# Patient Record
Sex: Male | Born: 1960 | Race: Black or African American | Hispanic: No | Marital: Married | Smoking: Never smoker
Health system: Southern US, Community
[De-identification: ages and names within clinical notes are randomized; demographics above are authoritative.]

## PROBLEM LIST (undated history)

## (undated) DIAGNOSIS — G5602 Carpal tunnel syndrome, left upper limb: Secondary | ICD-10-CM

## (undated) DIAGNOSIS — I10 Essential (primary) hypertension: Secondary | ICD-10-CM

## (undated) DIAGNOSIS — G992 Myelopathy in diseases classified elsewhere: Secondary | ICD-10-CM

## (undated) DIAGNOSIS — K219 Gastro-esophageal reflux disease without esophagitis: Secondary | ICD-10-CM

## (undated) DIAGNOSIS — E669 Obesity, unspecified: Secondary | ICD-10-CM

## (undated) DIAGNOSIS — M1990 Unspecified osteoarthritis, unspecified site: Secondary | ICD-10-CM

## (undated) DIAGNOSIS — M4802 Spinal stenosis, cervical region: Secondary | ICD-10-CM

## (undated) DIAGNOSIS — E7800 Pure hypercholesterolemia, unspecified: Secondary | ICD-10-CM

## (undated) DIAGNOSIS — R609 Edema, unspecified: Secondary | ICD-10-CM

## (undated) DIAGNOSIS — Z973 Presence of spectacles and contact lenses: Secondary | ICD-10-CM

## (undated) DIAGNOSIS — Z8739 Personal history of other diseases of the musculoskeletal system and connective tissue: Secondary | ICD-10-CM

## (undated) HISTORY — PX: MULTIPLE TOOTH EXTRACTIONS: SHX2053

## (undated) HISTORY — PX: ESOPHAGOGASTRODUODENOSCOPY: SHX1529

---

## 2017-01-05 ENCOUNTER — Other Ambulatory Visit: Payer: Self-pay | Admitting: Neurosurgery

## 2017-01-09 ENCOUNTER — Encounter (HOSPITAL_COMMUNITY)
Admission: RE | Admit: 2017-01-09 | Discharge: 2017-01-09 | Disposition: A | Discharge status: Home / Self Care | Payer: BLUE CROSS/BLUE SHIELD | Source: Ambulatory Visit | Attending: Neurosurgery | Admitting: Neurosurgery

## 2017-01-09 ENCOUNTER — Encounter (HOSPITAL_COMMUNITY): Payer: Self-pay

## 2017-01-09 DIAGNOSIS — I10 Essential (primary) hypertension: Secondary | ICD-10-CM | POA: Diagnosis not present

## 2017-01-09 DIAGNOSIS — M4712 Other spondylosis with myelopathy, cervical region: Secondary | ICD-10-CM | POA: Insufficient documentation

## 2017-01-09 DIAGNOSIS — Z01810 Encounter for preprocedural cardiovascular examination: Secondary | ICD-10-CM | POA: Diagnosis not present

## 2017-01-09 DIAGNOSIS — Z01812 Encounter for preprocedural laboratory examination: Secondary | ICD-10-CM | POA: Insufficient documentation

## 2017-01-09 DIAGNOSIS — Z01818 Encounter for other preprocedural examination: Secondary | ICD-10-CM | POA: Diagnosis present

## 2017-01-09 HISTORY — DX: Personal history of other diseases of the musculoskeletal system and connective tissue: Z87.39

## 2017-01-09 HISTORY — DX: Spinal stenosis, cervical region: M48.02

## 2017-01-09 HISTORY — DX: Edema, unspecified: R60.9

## 2017-01-09 HISTORY — DX: Carpal tunnel syndrome, left upper limb: G56.02

## 2017-01-09 HISTORY — DX: Presence of spectacles and contact lenses: Z97.3

## 2017-01-09 HISTORY — DX: Pure hypercholesterolemia, unspecified: E78.00

## 2017-01-09 HISTORY — DX: Obesity, unspecified: E66.9

## 2017-01-09 HISTORY — DX: Gastro-esophageal reflux disease without esophagitis: K21.9

## 2017-01-09 HISTORY — DX: Unspecified osteoarthritis, unspecified site: M19.90

## 2017-01-09 HISTORY — DX: Essential (primary) hypertension: I10

## 2017-01-09 HISTORY — DX: Myelopathy in diseases classified elsewhere: G99.2

## 2017-01-09 LAB — SURGICAL PCR SCREEN
MRSA, PCR: NEGATIVE
STAPHYLOCOCCUS AUREUS: NEGATIVE

## 2017-01-09 LAB — BASIC METABOLIC PANEL
Anion gap: 8 (ref 5–15)
BUN: 13 mg/dL (ref 6–20)
CALCIUM: 9.8 mg/dL (ref 8.9–10.3)
CO2: 25 mmol/L (ref 22–32)
Chloride: 104 mmol/L (ref 101–111)
Creatinine, Ser: 1.08 mg/dL (ref 0.61–1.24)
GFR calc Af Amer: >60 mL/min (ref >60)
GLUCOSE: 134 mg/dL — AB (ref 65–99)
Potassium: 4.1 mmol/L (ref 3.5–5.1)
Sodium: 137 mmol/L (ref 135–145)

## 2017-01-09 LAB — CBC
HEMATOCRIT: 39.3 % (ref 39.0–52.0)
Hemoglobin: 12.7 g/dL — ABNORMAL LOW (ref 13.0–17.0)
MCH: 26.6 pg (ref 26.0–34.0)
MCHC: 32.3 g/dL (ref 30.0–36.0)
MCV: 82.4 fL (ref 78.0–100.0)
Platelets: 205 10*3/uL (ref 150–400)
RBC: 4.77 MIL/uL (ref 4.22–5.81)
RDW: 14.5 % (ref 11.5–15.5)
WBC: 6.3 10*3/uL (ref 4.0–10.5)

## 2017-01-09 LAB — TYPE AND SCREEN
ABO/RH(D): O POS
Antibody Screen: NEGATIVE

## 2017-01-09 LAB — ABO/RH: ABO/RH(D): O POS

## 2017-01-09 NOTE — Pre-Procedure Instructions (Signed)
Jimmy Boyer  01/09/2017     No Pharmacies Listed   Your procedure is scheduled on Tues. June 12  [facility] Admitting at 5:30 A.M.  Call this number if you have problems the morning of surgery:  [phone]   Remember:  Do not eat food or drink liquids after midnight.  Take these medicines the morning of surgery with A SIP OF WATER :             1 week prior to surgery stop: advil, motrin, aleve, ibuprofen, BC Powders, Goody's, vitamins/herbal medicines.   Do not wear jewelry, make-up or nail polish.  Do not wear lotions, powders, or perfumes, or deoderant.  Do not shave 48 hours prior to surgery.  Men may shave face and neck.  Do not bring valuables to the hospital.  [facility] is not responsible for any belongings or valuables.  Contacts, dentures or bridgework may not be worn into surgery.  Leave your suitcase in the car.  After surgery it may be brought to your room.  For patients admitted to the hospital, discharge time will be determined by your treatment team.  Patients discharged the day of surgery will not be allowed to drive home.    Special instructions:   [facility]- Preparing For Surgery  Before surgery, you can play an important role. Because skin is not sterile, your skin needs to be as free of germs as possible. You can reduce the number of germs on your skin by washing with CHG (chlorahexidine gluconate) Soap before surgery.  CHG is an antiseptic cleaner which kills germs and bonds with the skin to continue killing germs even after washing.  Please do not use if you have an allergy to CHG or antibacterial soaps. If your skin becomes reddened/irritated stop using the CHG.  Do not shave (including legs and underarms) for at least 48 hours prior to first CHG shower. It is OK to shave your face.  Please follow these instructions carefully.   1. Shower the NIGHT BEFORE SURGERY and the MORNING OF SURGERY with CHG.   2. If you  chose to wash your hair, wash your hair first as usual with your normal shampoo.  3. After you shampoo, rinse your hair and body thoroughly to remove the shampoo.  4. Use CHG as you would any other liquid soap. You can apply CHG directly to the skin and wash gently with a scrungie or a clean washcloth.   5. Apply the CHG Soap to your body ONLY FROM THE NECK DOWN.  Do not use on open wounds or open sores. Avoid contact with your eyes, ears, mouth and genitals (private parts). Wash genitals (private parts) with your normal soap.  6. Wash thoroughly, paying special attention to the area where your surgery will be performed.  7. Thoroughly rinse your body with warm water from the neck down.  8. DO NOT shower/wash with your normal soap after using and rinsing off the CHG Soap.  9. Pat yourself dry with a CLEAN TOWEL.   10. Wear CLEAN PAJAMAS   11. Place CLEAN SHEETS on your bed the night of your first shower and DO NOT SLEEP WITH PETS.    Day of Surgery: Do not apply any deodorants/lotions. Please wear clean clothes to the hospital/surgery center.      Please read over the following fact sheets that you were given. Coughing and Deep Breathing, MRSA Information and Surgical Site Infection Prevention

## 2017-01-09 NOTE — Pre-Procedure Instructions (Signed)
    Jimmy Boyer  01/09/2017     No Pharmacies Listed   Your procedure is scheduled on Tuesday, January 17, 2017  [facility] at 5:30 A.M.  Call this number if you have problems the morning of surgery:  [phone]   Remember:  Do not eat food or drink liquids after midnight Monday, January 16, 2017  Take these medicines the morning of surgery with A SIP OF WATER : gabapentin (NEURONTIN)  Stop taking Aspirin,vitamins, fish oil, and herbal medications. Do not take any NSAIDs ie: Ibuprofen, Advil, Naproxen, BC and Goody Powderor any medication containing Aspirin; stop Tuesday, January 10, 2017  Do not wear jewelry, make-up or nail polish.  Do not wear lotions, powders, or perfumes, or deoderant.  Do not shave 48 hours prior to surgery.  Men may shave face and neck.  Do not bring valuables to the hospital.  [facility] is not responsible for any belongings or valuables. Contacts, dentures or bridgework may not be worn into surgery.  Leave your suitcase in the car.  After surgery it may be brought to your room. For patients admitted to the hospital, discharge time will be determined by your treatment team. Special instructions: Shower the night before surgery and the morning of surgery with CHG. Please read over the following fact sheets that you were given. Pain Booklet, Coughing and Deep Breathing, Blood Transfusion Information, MRSA Information and Surgical Site Infection Prevention

## 2017-01-09 NOTE — Progress Notes (Signed)
Pt denies SOB, chest pain, and being under the care of a cardiologist. Pt denies having a stress test, echo and cardiac cath. Pt denies having an EKG within the last year but stated that a [facility]; records requested. Pt chart forwarded to anesthesia to review EKG.

## 2017-01-16 MED ORDER — DEXTROSE 5 % IV SOLN
3.0000 g | INTRAVENOUS | Status: AC
  Administered 2017-01-17: 3 g via INTRAVENOUS
  Filled 2017-01-16: qty 3000

## 2017-01-16 NOTE — Anesthesia Preprocedure Evaluation (Addendum)
Anesthesia Evaluation  Patient identified by MRN, date of birth, ID band Patient awake    Reviewed: Allergy & Precautions, H&P , NPO status , Patient's Chart, lab work & pertinent test results  Airway Mallampati: II  TM Distance: >3 FB Neck ROM: Full    Dental no notable dental hx. (+) Teeth Intact, Dental Advisory Given   Pulmonary neg pulmonary ROS,    Pulmonary exam normal breath sounds clear to auscultation       Cardiovascular Exercise Tolerance: Good hypertension, Pt. on medications  Rhythm:Regular Rate:Normal     Neuro/Psych negative neurological ROS  negative psych ROS   GI/Hepatic Neg liver ROS, GERD  Controlled,  Endo/Other  negative endocrine ROS  Renal/GU negative Renal ROS  negative genitourinary   Musculoskeletal  (+) Arthritis , Osteoarthritis,    Abdominal   Peds  Hematology negative hematology ROS (+)   Anesthesia Other Findings   Reproductive/Obstetrics negative OB ROS                            Anesthesia Physical Anesthesia Plan  ASA: II  Anesthesia Plan: General   Post-op Pain Management:    Induction: Intravenous  PONV Risk Score and Plan: 3 and Ondansetron, Dexamethasone, Propofol and Midazolam  Airway Management Planned: Oral ETT  Additional Equipment:   Intra-op Plan:   Post-operative Plan: Extubation in OR  Informed Consent: I have reviewed the patients History and Physical, chart, labs and discussed the procedure including the risks, benefits and alternatives for the proposed anesthesia with the patient or authorized representative who has indicated his/her understanding and acceptance.   Dental advisory given  Plan Discussed with: CRNA  Anesthesia Plan Comments:        Anesthesia Quick Evaluation

## 2017-01-17 ENCOUNTER — Observation Stay (HOSPITAL_COMMUNITY)
Admission: AD | Admit: 2017-01-17 | Discharge: 2017-01-18 | DRG: 473 | Disposition: A | Discharge status: Home / Self Care | Payer: BLUE CROSS/BLUE SHIELD | Source: Ambulatory Visit | Attending: Neurosurgery | Admitting: Neurosurgery

## 2017-01-17 ENCOUNTER — Encounter (HOSPITAL_COMMUNITY): Payer: Self-pay | Admitting: *Deleted

## 2017-01-17 ENCOUNTER — Encounter (HOSPITAL_COMMUNITY)
Admission: AD | Disposition: A | Discharge status: Home / Self Care | Payer: Self-pay | Source: Ambulatory Visit | Attending: Neurosurgery

## 2017-01-17 ENCOUNTER — Ambulatory Visit (HOSPITAL_COMMUNITY): Payer: BLUE CROSS/BLUE SHIELD | Admitting: Emergency Medicine

## 2017-01-17 ENCOUNTER — Ambulatory Visit (HOSPITAL_COMMUNITY): Payer: BLUE CROSS/BLUE SHIELD

## 2017-01-17 DIAGNOSIS — M2578 Osteophyte, vertebrae: Secondary | ICD-10-CM | POA: Diagnosis not present

## 2017-01-17 DIAGNOSIS — G959 Disease of spinal cord, unspecified: Secondary | ICD-10-CM | POA: Diagnosis present

## 2017-01-17 DIAGNOSIS — M79603 Pain in arm, unspecified: Secondary | ICD-10-CM | POA: Diagnosis present

## 2017-01-17 DIAGNOSIS — Z419 Encounter for procedure for purposes other than remedying health state, unspecified: Secondary | ICD-10-CM

## 2017-01-17 DIAGNOSIS — K5900 Constipation, unspecified: Secondary | ICD-10-CM | POA: Diagnosis not present

## 2017-01-17 DIAGNOSIS — M50121 Cervical disc disorder at C4-C5 level with radiculopathy: Secondary | ICD-10-CM | POA: Insufficient documentation

## 2017-01-17 DIAGNOSIS — Z79899 Other long term (current) drug therapy: Secondary | ICD-10-CM | POA: Insufficient documentation

## 2017-01-17 DIAGNOSIS — M50021 Cervical disc disorder at C4-C5 level with myelopathy: Principal | ICD-10-CM | POA: Insufficient documentation

## 2017-01-17 DIAGNOSIS — I10 Essential (primary) hypertension: Secondary | ICD-10-CM | POA: Diagnosis not present

## 2017-01-17 DIAGNOSIS — M4802 Spinal stenosis, cervical region: Secondary | ICD-10-CM | POA: Insufficient documentation

## 2017-01-17 HISTORY — PX: ANTERIOR CERVICAL DECOMP/DISCECTOMY FUSION: SHX1161

## 2017-01-17 SURGERY — ANTERIOR CERVICAL DECOMPRESSION/DISCECTOMY FUSION 2 LEVELS
Anesthesia: General | Site: Spine Cervical

## 2017-01-17 MED ORDER — ONDANSETRON HCL 4 MG/2ML IJ SOLN
INTRAMUSCULAR | Status: DC | PRN
  Administered 2017-01-17: 4 mg via INTRAVENOUS

## 2017-01-17 MED ORDER — SUGAMMADEX SODIUM 500 MG/5ML IV SOLN
INTRAVENOUS | Status: AC
  Filled 2017-01-17: qty 5

## 2017-01-17 MED ORDER — GLYCOPYRROLATE 0.2 MG/ML IJ SOLN
INTRAMUSCULAR | Status: DC | PRN
  Administered 2017-01-17: 0.1 mg via INTRAVENOUS

## 2017-01-17 MED ORDER — PRAVASTATIN SODIUM 40 MG PO TABS: 40.0000 mg | ORAL_TABLET | Freq: Every day | ORAL | Status: DC

## 2017-01-17 MED ORDER — ACETAMINOPHEN 325 MG PO TABS: 650.0000 mg | ORAL_TABLET | ORAL | Status: DC | PRN

## 2017-01-17 MED ORDER — ACETAMINOPHEN 650 MG RE SUPP: 650.0000 mg | RECTAL | Status: DC | PRN

## 2017-01-17 MED ORDER — FENTANYL CITRATE (PF) 100 MCG/2ML IJ SOLN
INTRAMUSCULAR | Status: DC | PRN
  Administered 2017-01-17: 50 ug via INTRAVENOUS
  Administered 2017-01-17: 100 ug via INTRAVENOUS
  Administered 2017-01-17 (×2): 50 ug via INTRAVENOUS

## 2017-01-17 MED ORDER — LIDOCAINE HCL (CARDIAC) 20 MG/ML IV SOLN
INTRAVENOUS | Status: DC | PRN
  Administered 2017-01-17: 60 mg via INTRAVENOUS

## 2017-01-17 MED ORDER — LABETALOL HCL 5 MG/ML IV SOLN
INTRAVENOUS | Status: DC | PRN
  Administered 2017-01-17: 5 mg via INTRAVENOUS

## 2017-01-17 MED ORDER — HYDROMORPHONE HCL 1 MG/ML IJ SOLN
INTRAMUSCULAR | Status: AC
  Filled 2017-01-17: qty 1

## 2017-01-17 MED ORDER — PROPOFOL 10 MG/ML IV BOLUS
INTRAVENOUS | Status: DC | PRN
  Administered 2017-01-17: 140 mg via INTRAVENOUS
  Administered 2017-01-17: 30 mg via INTRAVENOUS

## 2017-01-17 MED ORDER — LIDOCAINE 2% (20 MG/ML) 5 ML SYRINGE
INTRAMUSCULAR | Status: AC
  Filled 2017-01-17: qty 5

## 2017-01-17 MED ORDER — SENNOSIDES-DOCUSATE SODIUM 8.6-50 MG PO TABS: 1.0000 | ORAL_TABLET | Freq: Every evening | ORAL | Status: DC | PRN

## 2017-01-17 MED ORDER — SODIUM CHLORIDE 0.9% FLUSH
3.0000 mL | Freq: Two times a day (BID) | INTRAVENOUS | Status: DC
  Administered 2017-01-17: 3 mL via INTRAVENOUS

## 2017-01-17 MED ORDER — PANTOPRAZOLE SODIUM 40 MG IV SOLR: 40.0000 mg | Freq: Every day | INTRAVENOUS | Status: DC

## 2017-01-17 MED ORDER — ALUM & MAG HYDROXIDE-SIMETH 200-200-20 MG/5ML PO SUSP: 30.0000 mL | Freq: Four times a day (QID) | ORAL | Status: DC | PRN

## 2017-01-17 MED ORDER — MIDAZOLAM HCL 2 MG/2ML IJ SOLN
INTRAMUSCULAR | Status: AC
  Filled 2017-01-17: qty 2

## 2017-01-17 MED ORDER — LIDOCAINE-EPINEPHRINE 1 %-1:100000 IJ SOLN
INTRAMUSCULAR | Status: AC
  Filled 2017-01-17: qty 1

## 2017-01-17 MED ORDER — SUGAMMADEX SODIUM 500 MG/5ML IV SOLN
INTRAVENOUS | Status: DC | PRN
  Administered 2017-01-17: 300 mg via INTRAVENOUS

## 2017-01-17 MED ORDER — THROMBIN 5000 UNITS EX SOLR
CUTANEOUS | Status: DC | PRN
  Administered 2017-01-17 (×2): 5000 [IU] via TOPICAL

## 2017-01-17 MED ORDER — THROMBIN 5000 UNITS EX SOLR
CUTANEOUS | Status: AC
  Filled 2017-01-17: qty 15000

## 2017-01-17 MED ORDER — MORPHINE SULFATE (PF) 4 MG/ML IV SOLN: 2.0000 mg | INTRAVENOUS | Status: DC | PRN

## 2017-01-17 MED ORDER — MIDAZOLAM HCL 5 MG/5ML IJ SOLN
INTRAMUSCULAR | Status: DC | PRN
  Administered 2017-01-17: 2 mg via INTRAVENOUS

## 2017-01-17 MED ORDER — DEXAMETHASONE SODIUM PHOSPHATE 10 MG/ML IJ SOLN
INTRAMUSCULAR | Status: AC
  Filled 2017-01-17: qty 1

## 2017-01-17 MED ORDER — THROMBIN 5000 UNITS EX SOLR
OROMUCOSAL | Status: DC | PRN
  Administered 2017-01-17: 5 mL via TOPICAL

## 2017-01-17 MED ORDER — MENTHOL 3 MG MT LOZG: 1.0000 | LOZENGE | OROMUCOSAL | Status: DC | PRN

## 2017-01-17 MED ORDER — ROCURONIUM BROMIDE 10 MG/ML (PF) SYRINGE
PREFILLED_SYRINGE | INTRAVENOUS | Status: AC
  Filled 2017-01-17: qty 5

## 2017-01-17 MED ORDER — LIDOCAINE-EPINEPHRINE 1 %-1:100000 IJ SOLN
INTRAMUSCULAR | Status: DC | PRN
  Administered 2017-01-17: 5 mL

## 2017-01-17 MED ORDER — LACTATED RINGERS IV SOLN
INTRAVENOUS | Status: DC | PRN
  Administered 2017-01-17 (×2): via INTRAVENOUS

## 2017-01-17 MED ORDER — TRIAMTERENE-HCTZ 37.5-25 MG PO CAPS
1.0000 | ORAL_CAPSULE | Freq: Every day | ORAL | Status: DC
  Administered 2017-01-17 – 2017-01-18 (×2): 1 via ORAL
  Filled 2017-01-17 (×2): qty 1

## 2017-01-17 MED ORDER — DEXTROSE 5 % IV SOLN
3.0000 g | Freq: Three times a day (TID) | INTRAVENOUS | Status: AC
  Administered 2017-01-17 (×2): 3 g via INTRAVENOUS
  Filled 2017-01-17 (×2): qty 3000

## 2017-01-17 MED ORDER — METHOCARBAMOL 500 MG PO TABS
500.0000 mg | ORAL_TABLET | Freq: Four times a day (QID) | ORAL | Status: DC | PRN
  Administered 2017-01-17 – 2017-01-18 (×3): 500 mg via ORAL
  Filled 2017-01-17 (×3): qty 1

## 2017-01-17 MED ORDER — BISACODYL 10 MG RE SUPP: 10.0000 mg | Freq: Every day | RECTAL | Status: DC | PRN

## 2017-01-17 MED ORDER — PROPOFOL 10 MG/ML IV BOLUS
INTRAVENOUS | Status: AC
  Filled 2017-01-17: qty 20

## 2017-01-17 MED ORDER — PANTOPRAZOLE SODIUM 40 MG PO TBEC
40.0000 mg | DELAYED_RELEASE_TABLET | Freq: Every day | ORAL | Status: DC
  Administered 2017-01-17: 40 mg via ORAL
  Filled 2017-01-17: qty 1

## 2017-01-17 MED ORDER — 0.9 % SODIUM CHLORIDE (POUR BTL) OPTIME
TOPICAL | Status: DC | PRN
  Administered 2017-01-17: 1000 mL

## 2017-01-17 MED ORDER — CHLORHEXIDINE GLUCONATE CLOTH 2 % EX PADS: 6.0000 | MEDICATED_PAD | Freq: Once | CUTANEOUS | Status: DC

## 2017-01-17 MED ORDER — FLEET ENEMA 7-19 GM/118ML RE ENEM: 1.0000 | ENEMA | Freq: Once | RECTAL | Status: DC | PRN

## 2017-01-17 MED ORDER — GABAPENTIN 300 MG PO CAPS
300.0000 mg | ORAL_CAPSULE | Freq: Three times a day (TID) | ORAL | Status: DC
  Administered 2017-01-17 – 2017-01-18 (×3): 300 mg via ORAL
  Filled 2017-01-17 (×3): qty 1

## 2017-01-17 MED ORDER — ONDANSETRON HCL 4 MG/2ML IJ SOLN
INTRAMUSCULAR | Status: AC
  Filled 2017-01-17: qty 2

## 2017-01-17 MED ORDER — ONDANSETRON HCL 4 MG/2ML IJ SOLN: 4.0000 mg | Freq: Four times a day (QID) | INTRAMUSCULAR | Status: DC | PRN

## 2017-01-17 MED ORDER — ROCURONIUM BROMIDE 100 MG/10ML IV SOLN
INTRAVENOUS | Status: DC | PRN
  Administered 2017-01-17: 10 mg via INTRAVENOUS
  Administered 2017-01-17: 50 mg via INTRAVENOUS
  Administered 2017-01-17: 10 mg via INTRAVENOUS
  Administered 2017-01-17: 5 mg via INTRAVENOUS

## 2017-01-17 MED ORDER — NEOSTIGMINE METHYLSULFATE 5 MG/5ML IV SOSY
PREFILLED_SYRINGE | INTRAVENOUS | Status: AC
  Filled 2017-01-17: qty 5

## 2017-01-17 MED ORDER — SENNOSIDES-DOCUSATE SODIUM 8.6-50 MG PO TABS
1.0000 | ORAL_TABLET | Freq: Every day | ORAL | Status: DC
  Administered 2017-01-18: 1 via ORAL
  Filled 2017-01-17: qty 1

## 2017-01-17 MED ORDER — TIZANIDINE HCL 4 MG PO TABS: 4.0000 mg | ORAL_TABLET | Freq: Every evening | ORAL | Status: DC | PRN

## 2017-01-17 MED ORDER — HYDROMORPHONE HCL 1 MG/ML IJ SOLN
0.2500 mg | INTRAMUSCULAR | Status: DC | PRN
  Administered 2017-01-17: 0.5 mg via INTRAVENOUS

## 2017-01-17 MED ORDER — KCL IN DEXTROSE-NACL 20-5-0.45 MEQ/L-%-% IV SOLN: INTRAVENOUS | Status: DC

## 2017-01-17 MED ORDER — ONDANSETRON HCL 4 MG PO TABS: 4.0000 mg | ORAL_TABLET | Freq: Four times a day (QID) | ORAL | Status: DC | PRN

## 2017-01-17 MED ORDER — HYDROCODONE-ACETAMINOPHEN 5-325 MG PO TABS
1.0000 | ORAL_TABLET | ORAL | Status: DC | PRN
Start: 2017-01-17 — End: 2017-01-18
  Administered 2017-01-17 – 2017-01-18 (×3): 2 via ORAL
  Filled 2017-01-17 (×3): qty 2

## 2017-01-17 MED ORDER — SODIUM CHLORIDE 0.9% FLUSH: 3.0000 mL | INTRAVENOUS | Status: DC | PRN

## 2017-01-17 MED ORDER — DEXTROSE 5 % IV SOLN
500.0000 mg | Freq: Four times a day (QID) | INTRAVENOUS | Status: DC | PRN
  Filled 2017-01-17: qty 5

## 2017-01-17 MED ORDER — HEMOSTATIC AGENTS (NO CHARGE) OPTIME
TOPICAL | Status: DC | PRN
  Administered 2017-01-17: 1 via TOPICAL

## 2017-01-17 MED ORDER — DOCUSATE SODIUM 100 MG PO CAPS
100.0000 mg | ORAL_CAPSULE | Freq: Two times a day (BID) | ORAL | Status: DC
  Administered 2017-01-17 – 2017-01-18 (×2): 100 mg via ORAL
  Filled 2017-01-17 (×2): qty 1

## 2017-01-17 MED ORDER — FENTANYL CITRATE (PF) 250 MCG/5ML IJ SOLN
INTRAMUSCULAR | Status: AC
  Filled 2017-01-17: qty 5

## 2017-01-17 MED ORDER — PHENOL 1.4 % MT LIQD
1.0000 | OROMUCOSAL | Status: DC | PRN
  Administered 2017-01-17: 1 via OROMUCOSAL
  Filled 2017-01-17: qty 177

## 2017-01-17 MED ORDER — SENNA-DOCUSATE SODIUM 8.6-50 MG PO TABS: 1.0000 | ORAL_TABLET | Freq: Every day | ORAL | Status: DC

## 2017-01-17 MED ORDER — BUPIVACAINE HCL (PF) 0.5 % IJ SOLN
INTRAMUSCULAR | Status: DC | PRN
  Administered 2017-01-17: 5 mL

## 2017-01-17 MED ORDER — ZOLPIDEM TARTRATE 5 MG PO TABS: 5.0000 mg | ORAL_TABLET | Freq: Every evening | ORAL | Status: DC | PRN

## 2017-01-17 SURGICAL SUPPLY — 73 items
BASKET BONE COLLECTION (BASKET) ×3 IMPLANT
BIT DRILL 14X2.5XNS TI ANT (BIT) ×1 IMPLANT
BIT DRILL AVIATOR 14 (BIT) ×1
BIT DRILL AVIATOR 14MM (BIT) ×1
BIT DRILL NEURO 2X3.1 SFT TUCH (MISCELLANEOUS) ×1 IMPLANT
BIT DRL 14X2.5XNS TI ANT (BIT) ×1
BLADE ULTRA TIP 2M (BLADE) ×3 IMPLANT
BNDG GAUZE ELAST 4 BULKY (GAUZE/BANDAGES/DRESSINGS) ×6 IMPLANT
BUR BARREL STRAIGHT FLUTE 4.0 (BURR) ×3 IMPLANT
CANISTER SUCT 3000ML PPV (MISCELLANEOUS) ×3 IMPLANT
CARTRIDGE OIL MAESTRO DRILL (MISCELLANEOUS) ×1 IMPLANT
COVER MAYO STAND STRL (DRAPES) ×3 IMPLANT
DECANTER SPIKE VIAL GLASS SM (MISCELLANEOUS) ×3 IMPLANT
DERMABOND ADVANCED (GAUZE/BANDAGES/DRESSINGS) ×2
DERMABOND ADVANCED .7 DNX12 (GAUZE/BANDAGES/DRESSINGS) ×1 IMPLANT
DIFFUSER DRILL AIR PNEUMATIC (MISCELLANEOUS) ×3 IMPLANT
DRAPE HALF SHEET 40X57 (DRAPES) IMPLANT
DRAPE LAPAROTOMY 100X72 PEDS (DRAPES) ×3 IMPLANT
DRAPE MICROSCOPE LEICA (MISCELLANEOUS) ×3 IMPLANT
DRAPE POUCH INSTRU U-SHP 10X18 (DRAPES) ×3 IMPLANT
DRILL NEURO 2X3.1 SOFT TOUCH (MISCELLANEOUS) ×3
DRSG OPSITE POSTOP 3X4 (GAUZE/BANDAGES/DRESSINGS) ×3 IMPLANT
DURAPREP 6ML APPLICATOR 50/CS (WOUND CARE) ×3 IMPLANT
ELECT COATED BLADE 2.86 ST (ELECTRODE) ×3 IMPLANT
ELECT REM PT RETURN 9FT ADLT (ELECTROSURGICAL) ×3
ELECTRODE REM PT RTRN 9FT ADLT (ELECTROSURGICAL) ×1 IMPLANT
GAUZE SPONGE 4X4 12PLY STRL (GAUZE/BANDAGES/DRESSINGS) IMPLANT
GAUZE SPONGE 4X4 16PLY XRAY LF (GAUZE/BANDAGES/DRESSINGS) IMPLANT
GLOVE BIO SURGEON STRL SZ 6.5 (GLOVE) ×4 IMPLANT
GLOVE BIO SURGEON STRL SZ8 (GLOVE) ×3 IMPLANT
GLOVE BIO SURGEONS STRL SZ 6.5 (GLOVE) ×2
GLOVE BIOGEL PI IND STRL 6.5 (GLOVE) ×1 IMPLANT
GLOVE BIOGEL PI IND STRL 7.0 (GLOVE) ×1 IMPLANT
GLOVE BIOGEL PI IND STRL 8 (GLOVE) ×1 IMPLANT
GLOVE BIOGEL PI IND STRL 8.5 (GLOVE) ×1 IMPLANT
GLOVE BIOGEL PI INDICATOR 6.5 (GLOVE) ×2
GLOVE BIOGEL PI INDICATOR 7.0 (GLOVE) ×2
GLOVE BIOGEL PI INDICATOR 8 (GLOVE) ×2
GLOVE BIOGEL PI INDICATOR 8.5 (GLOVE) ×2
GLOVE ECLIPSE 8.0 STRL XLNG CF (GLOVE) ×3 IMPLANT
GLOVE EXAM NITRILE LRG STRL (GLOVE) IMPLANT
GLOVE EXAM NITRILE XL STR (GLOVE) IMPLANT
GLOVE EXAM NITRILE XS STR PU (GLOVE) IMPLANT
GOWN STRL REUS W/ TWL LRG LVL3 (GOWN DISPOSABLE) ×1 IMPLANT
GOWN STRL REUS W/ TWL XL LVL3 (GOWN DISPOSABLE) ×1 IMPLANT
GOWN STRL REUS W/TWL 2XL LVL3 (GOWN DISPOSABLE) ×3 IMPLANT
GOWN STRL REUS W/TWL LRG LVL3 (GOWN DISPOSABLE) ×2
GOWN STRL REUS W/TWL XL LVL3 (GOWN DISPOSABLE) ×2
HALTER HD/CHIN CERV TRACTION D (MISCELLANEOUS) ×3 IMPLANT
HEMOSTAT POWDER KIT SURGIFOAM (HEMOSTASIS) ×3 IMPLANT
KIT BASIN OR (CUSTOM PROCEDURE TRAY) ×3 IMPLANT
KIT ROOM TURNOVER OR (KITS) ×3 IMPLANT
NEEDLE HYPO 18GX1.5 BLUNT FILL (NEEDLE) IMPLANT
NEEDLE HYPO 25X1 1.5 SAFETY (NEEDLE) ×3 IMPLANT
NEEDLE SPNL 22GX3.5 QUINCKE BK (NEEDLE) ×3 IMPLANT
NS IRRIG 1000ML POUR BTL (IV SOLUTION) ×3 IMPLANT
OIL CARTRIDGE MAESTRO DRILL (MISCELLANEOUS) ×3
PACK LAMINECTOMY NEURO (CUSTOM PROCEDURE TRAY) ×3 IMPLANT
PAD ARMBOARD 7.5X6 YLW CONV (MISCELLANEOUS) ×9 IMPLANT
PEEK SPACER AVS AS 7X14X16X4% (Peek) ×3 IMPLANT
PIN DISTRACTION 14MM (PIN) ×9 IMPLANT
PLATE AVIATOR ASSY 2LVL SZ 37 (Plate) ×3 IMPLANT
RUBBERBAND STERILE (MISCELLANEOUS) ×6 IMPLANT
SCREW AVIATOR VAR SELFTAP 4X14 (Screw) ×18 IMPLANT
SPACER CERV AVS 8X14X16 4D (Spacer) ×3 IMPLANT
SPONGE INTESTINAL PEANUT (DISPOSABLE) ×3 IMPLANT
SPONGE SURGIFOAM ABS GEL SZ50 (HEMOSTASIS) ×3 IMPLANT
STAPLER SKIN PROX WIDE 3.9 (STAPLE) IMPLANT
SUT VIC AB 3-0 SH 8-18 (SUTURE) ×6 IMPLANT
SYR 3ML LL SCALE MARK (SYRINGE) IMPLANT
TOWEL GREEN STERILE (TOWEL DISPOSABLE) ×3 IMPLANT
TOWEL GREEN STERILE FF (TOWEL DISPOSABLE) ×3 IMPLANT
WATER STERILE IRR 1000ML POUR (IV SOLUTION) ×3 IMPLANT

## 2017-01-17 NOTE — Interval H&P Note (Signed)
History and Physical Interval Note:  01/17/2017 7:35 AM  Jimmy Boyer  has presented today for surgery, with the diagnosis of Stenosis of cervical spine with myelopathy  The various methods of treatment have been discussed with the patient and family. After consideration of risks, benefits and other options for treatment, the patient has consented to  Procedure(s) with comments: C4-5 C5-6 Anterior cervical decompression/discectomy/fusion (N/A) - C4-5 C5-6 Anterior cervical decompression/discectomy/fusion as a surgical intervention .  The patient's history has been reviewed, patient examined, no change in status, stable for surgery.  I have reviewed the patient's chart and labs.  Questions were answered to the patient's satisfaction.     Myrtie Leuthold D

## 2017-01-17 NOTE — Progress Notes (Signed)
Orthopedic Tech Progress Note Patient Details:  Jimmy Boyer 17-Jun-1961 [id number]  Ortho Devices Type of Ortho Device: Soft collar Ortho Device/Splint Interventions: Application   [name] 01/17/2017, 11:12 AM

## 2017-01-17 NOTE — Brief Op Note (Signed)
01/17/2017  10:38 AM  PATIENT:  Lona Kettle  56 y.o. male  PRE-OPERATIVE DIAGNOSIS:  Stenosis of cervical spine with myelopathy, herniated cervical disc, radiculopathy, cervicalgia C 45, C 56 levels  POST-OPERATIVE DIAGNOSIS:  Stenosis of cervical spine with myelopathy, herniated cervical disc, radiculopathy, cervicalgia C 45, C 56 levels  PROCEDURE:  Procedure(s) with comments: Cervical Four-Five,Cervical Five-Six  Anterior cervical decompression/discectomy/fusion (N/A) - left side approach with PEEK cages, autograft, anterior cervical plate  SURGEON:  Surgeon(s) and Role:    Maeola Harman, MD - Primary  PHYSICIAN ASSISTANT:   ASSISTANTS: Poteat, RN   ANESTHESIA:   general  EBL:  Total I/O In: 1300 [I.V.:1300] Out: 50 [Blood:50]  BLOOD ADMINISTERED:none  DRAINS: none   LOCAL MEDICATIONS USED:  MARCAINE    and LIDOCAINE   SPECIMEN:  No Specimen  DISPOSITION OF SPECIMEN:  N/A  COUNTS:  YES  TOURNIQUET:  * No tourniquets in log *  DICTATION: DICTATION: Patient is 56 year old male with bilateral upper extremity pain and weakness with herniated cervical discs, cord compression, stenosis, myelopathy C 45 and C 56 levels  PROCEDURE: Patient was brought to operating room and following the smooth and uncomplicated induction of general endotracheal anesthesia his head was placed on a horseshoe head holder he was placed in 5 pounds of Holter traction and his anterior neck was prepped and draped in usual sterile fashion. An incision was made on the left side of midline after infiltrating the skin and subcutaneous tissues with local lidocaine. The platysmal layer was incised and subplatysmal dissection was performed exposing the anterior border sternocleidomastoid muscle. Using blunt dissection the carotid sheath was kept lateral and trachea and esophagus kept medial exposing the anterior cervical spine. A bent spinal needle was placed it was felt to be the C 45 level and this  was confirmed on intraoperative x-ray. Longus coli muscles were taken down from the anterior cervical spine using electrocautery and key elevator and self-retaining retractor was placed exposing the C 45 and C 56 levels. The interspaces were incised and a thorough discectomy was performed. Distraction pins were placed. Initially the C 56 level was operated. Uncinate spurs and central spondylitic ridges were drilled down with a high-speed drill. The spinal cord dura and both C6 nerve roots were widely decompressed. Hemostasis was assured. After trial sizing an 8 mm peek interbody cage was selected and packed with local autograft. This was tamped into position and countersunk appropriately. Attention was the paid to the C 45 level, where similar decompression was performed.  Large ventral osteophytes were removed and saved for bone grafting.  Uncinate spurs and central spondylitic ridges were drilled down with a high-speed drill. The spinal cord dura and both C 5 nerve roots were widely decompressed. Hemostasis was assured. After trial sizing a 7 mm peek interbody cage was selected and packed in a similar fashion. This was tamped into position and countersunk appropriately.Distraction weight was removed. A 37 mm Aviator anterior cervical plate was affixed to the cervical spine with 14 mm variable-angle screws 2 at C 4, 2 at C 5 and 2 at C 6. All screws were well-positioned and locking mechanisms were engaged. A final X ray was obtained which showed well positioned grafts and anterior plate without complicating features. Soft tissues were inspected and found to be in good repair. The wound was irrigated. The platysma layer was closed with 3-0 Vicryl stitches and the skin was reapproximated with 3-0 Vicryl subcuticular stitches. The wound was dressed  with Dermabond. Counts were correct at the end of the case. Patient was extubated and taken to recovery in stable and satisfactory condition.    PLAN OF CARE: Admit to  inpatient   PATIENT DISPOSITION:  PACU - hemodynamically stable.   Delay start of Pharmacological VTE agent (>24hrs) due to surgical blood loss or risk of bleeding: yes

## 2017-01-17 NOTE — Anesthesia Postprocedure Evaluation (Signed)
Anesthesia Post Note  Patient: Jimmy Boyer  Procedure(s) Performed: Procedure(s) (LRB): Cervical Four-Five,Cervical Five-Six  Anterior cervical decompression/discectomy/fusion (N/A)     Patient location during evaluation: PACU Anesthesia Type: General Level of consciousness: awake and alert Pain management: pain level controlled Vital Signs Assessment: post-procedure vital signs reviewed and stable Respiratory status: spontaneous breathing, nonlabored ventilation and respiratory function stable Cardiovascular status: blood pressure returned to baseline and stable Postop Assessment: no signs of nausea or vomiting Anesthetic complications: no    Last Vitals:  Vitals:   01/17/17 1134 01/17/17 1145  BP: (!) 155/83   Pulse: 78 78  Resp: 17 20  Temp:  36.7 C    Last Pain:  Vitals:   01/17/17 1145  TempSrc:   PainSc: 0-No pain                 Autym Siess,W. EDMOND

## 2017-01-17 NOTE — H&P (Signed)
Patient ID:   [id number] Patient: Jimmy Boyer  Date of Birth: [birth year] Visit Type: Office Visit   Date: 01/04/2017 11:30 AM Provider: Danae Orleans. Venetia Maxon MD   This 56 year old male presents for pain.   History of Present Illness: 1.  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  Patient noticed in February balance difficulties and weakness, with burning numbness in arms and legs.  Constipation is managed with OTC meds. Primary physician Dr. Byrd Hesselbach took him out of work February and he was referred to ortho and Neurology.  He was referred here by Dr. Donah Driver, neurologist.  Physical therapy offered no relief  Gabapentin 100 mg 2 t.i.d. Tizanidine 4 mg 2 hs  History:  HTN, cholesterol Surgical history:  None  MRI and x-ray on [facility]           PAST MEDICAL/SURGICAL HISTORY   (Detailed)     Family History  (Detailed)   Social History:  (Detailed) Tobacco use reviewed. Preferred language is Unknown.   Tobacco use status: Current non-smoker. Smoking status: Never smoker.  SMOKING STATUS Type Smoking Status Usage Per Day Years Used Total Pack Years   Never smoker          MEDICATIONS(added, continued or stopped this visit):   ALLERGIES: Ingredient Reaction Medication Name Comment  NO KNOWN ALLERGIES     No known allergies.   Review of Systems System Neg/Pos Details  Constitutional Negative Chills, Fatigue, Fever, Malaise, Night sweats, Weight gain and Weight loss.  ENMT Negative Ear drainage, Hearing loss, Nasal drainage, Otalgia, Sinus pressure and Sore throat.  Eyes Negative Eye discharge, Eye pain and Vision changes.  Respiratory Negative Chronic cough, Cough, Dyspnea, Known TB exposure and Wheezing.  Cardio Negative Chest pain, Claudication, Edema and Irregular heartbeat/palpitations.  GI Positive Constipation.  GU Negative Dribbling, Dysuria, Erectile dysfunction, Hematuria,  Polyuria (Genitourinary), Slow stream, Urinary frequency, Urinary incontinence and Urinary retention.  Endocrine Negative Cold intolerance, Heat intolerance, Polydipsia and Polyphagia.  Neuro Positive Extremity weakness, Gait disturbance, Numbness in extremity.  Psych Negative Anxiety, Depression and Insomnia.  Integumentary Negative Brittle hair, Brittle nails, Change in shape/size of mole(s), Hair loss, Hirsutism, Hives, Pruritus, Rash and Skin lesion.  MS Negative Back pain, Joint pain, Joint swelling, Muscle weakness and Neck pain.  Hema/Lymph Negative Easy bleeding, Easy bruising and Lymphadenopathy.  Allergic/Immuno Negative Contact allergy, Environmental allergies, Food allergies and Seasonal allergies.  Reproductive Negative Penile discharge and Sexual dysfunction.   Vitals Date Temp F BP Pulse Ht In Wt Lb BMI BSA Pain Score  01/04/2017  176/93 80 76 285 34.69  10/10     PHYSICAL EXAM General Level of Distress: no acute distress Overall Appearance: normal  Head and Face  Right Left  Fundoscopic Exam:  normal normal    Cardiovascular Cardiac: regular rate and rhythm without murmur  Right Left  Carotid Pulses: normal normal  Respiratory Lungs: clear to auscultation  Neurological Orientation: normal Recent and Remote Memory: normal Attention Span and Concentration:   normal Language: normal Fund of Knowledge: normal  Right Left Sensation: normal normal Upper Extremity Coordination: normal normal  Lower Extremity Coordination: normal normal  Musculoskeletal Gait and Station: normal  Right Left Upper Extremity Muscle Strength: normal normal Lower Extremity Muscle Strength: normal normal Upper Extremity Muscle Tone:  normal normal Lower Extremity Muscle Tone: normal normal   Motor Strength Upper and lower extremity motor strength was tested in the clinically pertinent muscles. Any  abnormal findings will be noted below.   Right Left Triceps: 4-/5  Finger  Extensor: 3/5 3/5   Deep Tendon Reflexes  Right Left Biceps: normal normal Triceps: normal normal Brachioradialis: normal normal Patellar: normal normal Achilles: normal normal  Cranial Nerves II. Optic Nerve/Visual Fields: normal III. Oculomotor: normal IV. Trochlear: normal V. Trigeminal: normal VI. Abducens: normal VII. Facial: normal VIII. Acoustic/Vestibular: normal IX. Glossopharyngeal: normal X. Vagus: normal XI. Spinal Accessory: normal XII. Hypoglossal: normal  Motor and other Tests Lhermittes: positive Rhomberg: negative Pronator drift: absent     Right Left Hoffman's: present present Clonus: sustained sustained Babinski: normal normal   Additional Findings:  positive Lhermitte's sign, right triceps 4-/5, 3/5 right hand intrinsics, positive hoffman's sign bilaterally, 3-/5 left hand intrinsics, great toes down going, clonus worse on left at ankles, LE hyperreflexive bilaterally, weakness in both hip abductors, decreased pin sensation in hands, general cranial nerve exam normal, UE slightly hyperreflexive    DIAGNOSTIC RESULTS MRI 10/31/2016 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 Relatively severe central canal stenosis at C5-6 with moderate left and mild right foraminal stenosis. Moderate central canal stenosis at C4-5 with moderate left and mild right foraminal stenosis.     IMPRESSION cervical MRI reveals stenosis at C4-5 worse on left, ruptured disc and spinal cord compression at C5-6   physical examination: positive Lhermitte's sign, right triceps 4-/5, 3/5 right hand intrinsics, positive hoffman's sign bilaterally, 3-/5 left hand intrinsics, great toes down going, clonus worse on left at ankles, LE hyperreflexive bilaterally, weakness in both hip abductors, decreased pin sensation in hands, general cranial nerve exam normal, UE slightly hyperreflexive   recommend he increase gabapentin dosage.  I would recommend a  C4-5 and C5-6 ACDF due to patient's significant weakness and MRI results.  Completed Orders (this encounter) Order Details Reason Side Interpretation Result Initial Treatment Date Region  Cervical Spine- AP/Lat/Obls/Odontoid/Flex/Ex W/SWIMMER'S     01/04/2017 All Levels to All Levels   Assessment/Plan # Detail Type Description   1. Assessment Cervical radiculopathy (M54.12).       2. Assessment Stenosis of cervical spine with myelopathy (M47.12).   Plan Orders Soft Collar Regular [phone]([id number]).       3. Assessment Herniated nucleus pulposus, cervical (M50.20).           Pain Management Plan Pain Scale: 10/10. Method: Numeric Pain Intensity Scale. Onset: 09/09/2016.  increase gabapentin dosage. fit for soft cervical collar. nurse education given. scheduled C4-5 and C5-6 ACDF.   Orders: Diagnostic Procedures: Assessment Procedure  M54.12 Cervical Spine- AP/Lat/Obls/Odontoid/Flex/Ex  M54.12 Cervical Spine- Lateral  Miscellaneous: Assessment   M47.12 Soft Collar Regular ([id number])             Provider:  Venetia MaxonStern MD, Danae OrleansJoseph D 01/04/2017 2:02 PM  Dictation edited by: Philis Kendallavid Topher Buenaventura    CC Providers: Adonis HugueninMichael  Waters [address] [place],  [place]  [phone]   Freddi StarrVictor Owusu-Yaw  [address] [place], [place] [phone]              Electronically signed by Danae OrleansJoseph D. Venetia MaxonStern MD on 01/05/2017 05:48 PM

## 2017-01-17 NOTE — Progress Notes (Signed)
Awake, alert, conversant.  MAEW with full power, bilateral Deltoid, Biceps, Triceps, Hand Intrinsics.  Doing well.

## 2017-01-17 NOTE — Evaluation (Signed)
Physical Therapy Evaluation Patient Details Name: Jimmy Boyer MRN: [id number] DOB: 1960-08-10 Today's Date: 01/17/2017   History of Present Illness  Pt is a 56 y/o male who presents s/p C4-C6 ACDF on 01/17/17.  Clinical Impression  Pt admitted with above diagnosis. Pt currently with functional limitations due to the deficits listed below (see PT Problem List). At the time of PT eval pt was ambulating well in the hall with minimal pain reported. He is mobilizing at a gross supervision level with RW and will need to complete stair training prior to d/c. Will have adequate assist at home upon d/c. Pt will benefit from skilled PT to increase their independence and safety with mobility to allow discharge to the venue listed below.       Follow Up Recommendations No PT follow up;Supervision for mobility/OOB    Equipment Recommendations  3in1 (PT) (will defer to OT if their recommendation is different)    Recommendations for Other Services       Precautions / Restrictions Precautions Precautions: Fall;Cervical Precaution Comments: Precaution sheet issued and reviewed in detail. Pt was cued for precautions during functional mobility.  Required Braces or Orthoses: Cervical Brace Cervical Brace: Soft collar Restrictions Weight Bearing Restrictions: No      Mobility  Bed Mobility Overal bed mobility: Needs Assistance Bed Mobility: Rolling;Sit to Sidelying Rolling: Modified independent (Device/Increase time)       Sit to sidelying: Supervision General bed mobility comments: VC's for log roll technique. No assist required.   Transfers Overall transfer level: Needs assistance Equipment used: Rolling walker (2 wheeled) Transfers: Sit to/from Stand Sit to Stand: Supervision         General transfer comment: VC's for hand placement on seated surface for safety.   Ambulation/Gait Ambulation/Gait assistance: Supervision Ambulation Distance (Feet): 400 Feet Assistive device:  Rolling walker (2 wheeled) Gait Pattern/deviations: Step-through pattern;Decreased stride length;Trunk flexed Gait velocity: Decreased Gait velocity interpretation: Below normal speed for age/gender General Gait Details: VC's for improved posture and safety with the RW. Close supervision provided.  Stairs            Wheelchair Mobility    Modified Rankin (Stroke Patients Only)       Balance Overall balance assessment: Needs assistance Sitting-balance support: Feet supported;No upper extremity supported Sitting balance-Leahy Scale: Good     Standing balance support: No upper extremity supported Standing balance-Leahy Scale: Fair                               Pertinent Vitals/Pain Pain Assessment: No/denies pain    Home Living Family/patient expects to be discharged to:: Private residence Living Arrangements: Spouse/significant other;Children;Other relatives Available Help at Discharge: Family;Available 24 hours/day Type of Home: House Home Access: Stairs to enter   [employer] of Steps: 3 Home Layout: One level Home Equipment: Walker - 2 wheels;Cane - single point      Prior Function Level of Independence: Independent with assistive device(s)         Comments: Using RW all the time prior to surgery.      Hand Dominance   Dominant Hand: Right    Extremity/Trunk Assessment   Upper Extremity Assessment Upper Extremity Assessment: Defer to OT evaluation    Lower Extremity Assessment Lower Extremity Assessment: Generalized weakness (Consistent with pre-op diagnosis)    Cervical / Trunk Assessment Cervical / Trunk Assessment: Other exceptions Cervical / Trunk Exceptions: s/p surgery  Communication  Cognition Arousal/Alertness: Awake/alert Behavior During Therapy: WFL for tasks assessed/performed Overall Cognitive Status: Within Functional Limits for tasks assessed                                         General Comments      Exercises     Assessment/Plan    PT Assessment Patient needs continued PT services  PT Problem List Decreased strength;Decreased range of motion;Decreased activity tolerance;Decreased balance;Decreased mobility;Decreased knowledge of use of DME;Decreased safety awareness;Decreased knowledge of precautions;Pain       PT Treatment Interventions DME instruction;Gait training;Stair training;Functional mobility training;Therapeutic activities;Therapeutic exercise;Neuromuscular re-education;Patient/family education    PT Goals (Current goals can be found in the Care Plan section)  Acute Rehab PT Goals Patient Stated Goal: Home tomorrow PT Goal Formulation: With patient/family Time For Goal Achievement: 01/24/17 Potential to Achieve Goals: Good    Frequency Min 5X/week   Barriers to discharge        Co-evaluation               AM-PAC PT "6 Clicks" Daily Activity  Outcome Measure Difficulty turning over in bed (including adjusting bedclothes, sheets and blankets)?: None Difficulty moving from lying on back to sitting on the side of the bed? : None Difficulty sitting down on and standing up from a chair with arms (e.g., wheelchair, bedside commode, etc,.)?: None Help needed moving to and from a bed to chair (including a wheelchair)?: None Help needed walking in hospital room?: A Little Help needed climbing 3-5 steps with a railing? : A Little 6 Click Score: 22    End of Session Equipment Utilized During Treatment: Gait belt;Cervical collar Activity Tolerance: Patient tolerated treatment well Patient left: in bed;with call bell/phone within reach;with family/visitor present Nurse Communication: Mobility status PT Visit Diagnosis: Other abnormalities of gait and mobility (R26.89)    Time: 1425-1445 PT Time Calculation (min) (ACUTE ONLY): 20 min   Charges:   PT Evaluation $PT Eval Moderate Complexity: 1 Procedure     PT G Codes:        Conni SlipperLaura  Ivy Puryear, PT, DPT Acute Rehabilitation Services Pager: [phone]   Marylynn PearsonLaura D Mickala Laton 01/17/2017, 3:02 PM

## 2017-01-17 NOTE — Transfer of Care (Signed)
Immediate Anesthesia Transfer of Care Note  Patient: Jimmy Boyer  Procedure(s) Performed: Procedure(s) with comments: Cervical Four-Five,Cervical Five-Six  Anterior cervical decompression/discectomy/fusion (N/A) - left side approach  Patient Location: PACU  Anesthesia Type:General  Level of Consciousness: awake, alert  and oriented  Airway & Oxygen Therapy: Patient Spontanous Breathing and Patient connected to nasal cannula oxygen  Post-op Assessment: Report given to RN and Post -op Vital signs reviewed and stable  Post vital signs: Reviewed and stable  Last Vitals:  Vitals:   01/17/17 0618  BP: (!) 144/89  Pulse: 71  Resp: 20  Temp: 36.9 C    Last Pain:  Vitals:   01/17/17 0713  TempSrc:   PainSc: 6          Complications: No apparent anesthesia complications

## 2017-01-17 NOTE — Anesthesia Procedure Notes (Signed)
Procedure Name: Intubation Performed by: Marena ChancyBECKNER, Lameisha Schuenemann S Pre-anesthesia Checklist: Patient identified, Emergency Drugs available, Suction available, Patient being monitored and Timeout performed Patient Re-evaluated:Patient Re-evaluated prior to inductionOxygen Delivery Method: Circle system utilized Preoxygenation: Pre-oxygenation with 100% oxygen Intubation Type: IV induction Ventilation: Mask ventilation without difficulty Laryngoscope Size: 4 and Glidescope Grade View: Grade I Tube type: Oral Tube size: 8.0 mm Number of attempts: 1 Airway Equipment and Method: Patient positioned with wedge pillow and Stylet Placement Confirmation: ETT inserted through vocal cords under direct vision,  positive ETCO2,  CO2 detector and breath sounds checked- equal and bilateral Secured at: 25 cm Tube secured with: Tape Dental Injury: Teeth and Oropharynx as per pre-operative assessment

## 2017-01-17 NOTE — OR Nursing (Signed)
Handoff report given to Aisha RN at bedside.  Pt ambulated from the wheelchair to the bathroom and then to the bed with moderate assistance of one.  Pt stated he walks with a walker.

## 2017-01-17 NOTE — Op Note (Signed)
01/17/2017  10:38 AM  PATIENT:  Jimmy Boyer  56 y.o. male  PRE-OPERATIVE DIAGNOSIS:  Stenosis of cervical spine with myelopathy, herniated cervical disc, radiculopathy, cervicalgia C 45, C 56 levels  POST-OPERATIVE DIAGNOSIS:  Stenosis of cervical spine with myelopathy, herniated cervical disc, radiculopathy, cervicalgia C 45, C 56 levels  PROCEDURE:  Procedure(s) with comments: Cervical Four-Five,Cervical Five-Six  Anterior cervical decompression/discectomy/fusion (N/A) - left side approach with PEEK cages, autograft, anterior cervical plate  SURGEON:  Surgeon(s) and Role:    Maeola Harman, MD - Primary  PHYSICIAN ASSISTANT:   ASSISTANTS: Poteat, RN   ANESTHESIA:   general  EBL:  Total I/O In: 1300 [I.V.:1300] Out: 50 [Blood:50]  BLOOD ADMINISTERED:none  DRAINS: none   LOCAL MEDICATIONS USED:  MARCAINE    and LIDOCAINE   SPECIMEN:  No Specimen  DISPOSITION OF SPECIMEN:  N/A  COUNTS:  YES  TOURNIQUET:  * No tourniquets in log *  DICTATION: DICTATION: Patient is 56 year old male with bilateral upper extremity pain and weakness with herniated cervical discs, cord compression, stenosis, myelopathy C 45 and C 56 levels  PROCEDURE: Patient was brought to operating room and following the smooth and uncomplicated induction of general endotracheal anesthesia his head was placed on a horseshoe head holder he was placed in 5 pounds of Holter traction and his anterior neck was prepped and draped in usual sterile fashion. An incision was made on the left side of midline after infiltrating the skin and subcutaneous tissues with local lidocaine. The platysmal layer was incised and subplatysmal dissection was performed exposing the anterior border sternocleidomastoid muscle. Using blunt dissection the carotid sheath was kept lateral and trachea and esophagus kept medial exposing the anterior cervical spine. A bent spinal needle was placed it was felt to be the C 45 level and this  was confirmed on intraoperative x-ray. Longus coli muscles were taken down from the anterior cervical spine using electrocautery and key elevator and self-retaining retractor was placed exposing the C 45 and C 56 levels. The interspaces were incised and a thorough discectomy was performed. Distraction pins were placed. Initially the C 56 level was operated. Uncinate spurs and central spondylitic ridges were drilled down with a high-speed drill. The spinal cord dura and both C6 nerve roots were widely decompressed. Hemostasis was assured. After trial sizing an 8 mm peek interbody cage was selected and packed with local autograft. This was tamped into position and countersunk appropriately. Attention was the paid to the C 45 level, where similar decompression was performed.  Large ventral osteophytes were removed and saved for bone grafting.  Uncinate spurs and central spondylitic ridges were drilled down with a high-speed drill. The spinal cord dura and both C 5 nerve roots were widely decompressed. Hemostasis was assured. After trial sizing a 7 mm peek interbody cage was selected and packed in a similar fashion. This was tamped into position and countersunk appropriately.Distraction weight was removed. A 37 mm Aviator anterior cervical plate was affixed to the cervical spine with 14 mm variable-angle screws 2 at C 4, 2 at C 5 and 2 at C 6. All screws were well-positioned and locking mechanisms were engaged. A final X ray was obtained which showed well positioned grafts and anterior plate without complicating features. Soft tissues were inspected and found to be in good repair. The wound was irrigated. The platysma layer was closed with 3-0 Vicryl stitches and the skin was reapproximated with 3-0 Vicryl subcuticular stitches. The wound was dressed  with Dermabond. Counts were correct at the end of the case. Patient was extubated and taken to recovery in stable and satisfactory condition.    PLAN OF CARE: Admit to  inpatient   PATIENT DISPOSITION:  PACU - hemodynamically stable.   Delay start of Pharmacological VTE agent (>24hrs) due to surgical blood loss or risk of bleeding: yes

## 2017-01-18 ENCOUNTER — Encounter (HOSPITAL_COMMUNITY): Payer: Self-pay | Admitting: Neurosurgery

## 2017-01-18 DIAGNOSIS — M50021 Cervical disc disorder at C4-C5 level with myelopathy: Secondary | ICD-10-CM | POA: Diagnosis not present

## 2017-01-18 MED ORDER — HYDROCODONE-ACETAMINOPHEN 5-325 MG PO TABS: 1.0000 | ORAL_TABLET | ORAL | 0 refills | Status: AC | PRN

## 2017-01-18 MED ORDER — TIZANIDINE HCL 4 MG PO TABS: 4.0000 mg | ORAL_TABLET | Freq: Four times a day (QID) | ORAL | 0 refills | Status: AC | PRN

## 2017-01-18 NOTE — Discharge Summary (Signed)
Physician Discharge Summary  Patient ID: Jimmy Boyer MRN: [id number] DOB/AGE: Mar 17, 1961 56 y.o.  Admit date: 01/17/2017 Discharge date: 01/18/2017  Admission Diagnoses:Stenosis of cervical spine with myelopathy, herniated cervical disc, radiculopathy, cervicalgia C 45, C 56 levels    Discharge Diagnoses: Stenosis of cervical spine with myelopathy, herniated cervical disc, radiculopathy, cervicalgia C 45, C 56 levels; s/p Cervical Four-Five,Cervical Five-Six Anterior cervical decompression/discectomy/fusion (N/A) - left side approach with PEEK cages, autograft, anterior cervical plate     Active Problems:   Cervical myelopathy [facility](HCC)   Discharged Condition: good  Hospital Course: Jimmy Boyer was admitted for surgery with cervical stenosis, myelopathy, and radiculopathy. Folowing uncomplicated ACDF he recovered nicely with improved strength evident immediately post-op. He has mobilized nicely.  Consults: None  Significant Diagnostic Studies: radiology: X-Ray: intra-op  Treatments: surgery: Cervical Four-Five,Cervical Five-Six Anterior cervical decompression/discectomy/fusion (N/A) - left side approach with PEEK cages, autograft, anterior cervical plate    Discharge Exam: Blood pressure (!) 146/81, pulse 85, temperature 98.5 F (36.9 C), temperature source Oral, resp. rate 18, SpO2 99 %. Alert, conversat. Reports mild persistent numbness, tingling left hand and forearm, much improved. Exam reveals much improved BUE strength and hand intrinsics. Incision flat, without erythema or drainage beneath honeycomb and Dermabond.     Disposition: Discharge to home. Pt verbalizes understanding of d/c instructions, aware residual symptoms may take months to resolve. Rx's for Tizanidine 2mg  1-2 po q8jrs prn spasm &Norco 5/325 1-2 po q4-6hrs prn pain, and Gabapentinm 300mg  po tid will be sent. Pt has f/u appt with DrStern scheduled.    Allergies as of  01/18/2017      Reactions   No Known Allergies       Medication List    TAKE these medications   gabapentin 100 MG capsule Commonly known as:  NEURONTIN Take 300 mg by mouth 3 (three) times daily.   HYDROcodone-acetaminophen 5-325 MG tablet Commonly known as:  NORCO/VICODIN Take 1-2 tablets by mouth every 4 (four) hours as needed (breakthrough pain).   lovastatin 40 MG tablet Commonly known as:  MEVACOR Take 40 mg by mouth daily.   sennosides-docusate sodium 8.6-50 MG tablet Commonly known as:  SENOKOT-S Take 1 tablet by mouth daily.   tiZANidine 4 MG tablet Commonly known as:  ZANAFLEX Take 4-8 mg by mouth at bedtime as needed for muscle spasms. What changed:  Another medication with the same name was added. Make sure you understand how and when to take each.   tiZANidine 4 MG tablet Commonly known as:  ZANAFLEX Take 1 tablet (4 mg total) by mouth every 6 (six) hours as needed for muscle spasms. What changed:  You were already taking a medication with the same name, and this prescription was added. Make sure you understand how and when to take each.   triamterene-hydrochlorothiazide 37.5-25 MG capsule Commonly known as:  DYAZIDE Take 1 capsule by mouth daily.        Signed: Dorian HeckleSTERN,Nikolai Wilczak D, MD 01/18/2017, 8:35 AM

## 2017-01-18 NOTE — Progress Notes (Addendum)
Subjective: Patient reports "I still have a little burning in my arm, but not as much"  Objective: Vital signs in last 24 hours: Temp:  [97.7 F (36.5 C)-98.9 F (37.2 C)] 98.5 F (36.9 C) (06/13 0308) Pulse Rate:  [76-93] 85 (06/13 0308) Resp:  [8-20] 18 (06/13 0308) BP: (136-157)/(81-101) 146/81 (06/13 0308) SpO2:  [91 %-100 %] 99 % (06/13 0308)  Intake/Output from previous day: 06/12 0701 - 06/13 0700 In: 1780 [P.O.:480; I.V.:1300] Out: 50 [Blood:50] Intake/Output this shift: No intake/output data recorded.  Alert, conversat. Reports mild persistent numbness, tingling left hand and forearm, much improved. Exam reveals much improved BUE strength and hand intrinsics. Incision flat, without erythema or drainage beneath honeycomb and Dermabond.   Lab Results: No results for input(s): WBC, HGB, HCT, PLT in the last 72 hours. BMET No results for input(s): NA, K, CL, CO2, GLUCOSE, BUN, CREATININE, CALCIUM in the last 72 hours.  Studies/Results: Dg Cervical Spine 1 View  Result Date: 01/17/2017 CLINICAL DATA:  ACDF C4-C6 EXAM: DG C-ARM 61-120 MIN; DG CERVICAL SPINE - 1 VIEW COMPARISON:  None. FINDINGS: Fluoroscopy time 0 minutes 6 seconds. Solitary spot fluoroscopic nondiagnostic intraoperative lateral cervical spine radiograph demonstrates surgical plate with interlocking screws from C4-C6, only partially visualized at the C6 level. The C4 pedicle screws overlie the anterior inferior margin of the C4 vertebral body. IMPRESSION: Intraoperative fluoroscopic guidance for ACDF C4-C6. The C4 pedicle screws overlie the anterior inferior margin of the C4 vertebral body on this single lateral intraoperative view. Recommend dedicated cervical spine radiographs for further evaluation. These results will be called to the ordering clinician or representative by the Radiologist Assistant, and communication documented in the PACS or zVision Dashboard. Electronically Signed   By: Delbert PhenixJason A Poff M.D.   On:  01/17/2017 09:51   Dg C-arm 1-60 Min  Result Date: 01/17/2017 CLINICAL DATA:  ACDF C4-C6 EXAM: DG C-ARM 61-120 MIN; DG CERVICAL SPINE - 1 VIEW COMPARISON:  None. FINDINGS: Fluoroscopy time 0 minutes 6 seconds. Solitary spot fluoroscopic nondiagnostic intraoperative lateral cervical spine radiograph demonstrates surgical plate with interlocking screws from C4-C6, only partially visualized at the C6 level. The C4 pedicle screws overlie the anterior inferior margin of the C4 vertebral body. IMPRESSION: Intraoperative fluoroscopic guidance for ACDF C4-C6. The C4 pedicle screws overlie the anterior inferior margin of the C4 vertebral body on this single lateral intraoperative view. Recommend dedicated cervical spine radiographs for further evaluation. These results will be called to the ordering clinician or representative by the Radiologist Assistant, and communication documented in the PACS or zVision Dashboard. Electronically Signed   By: Delbert PhenixJason A Poff M.D.   On: 01/17/2017 09:51    Assessment/Plan: Improving   LOS: 1 day  Per DrStern, d/c IV, d/c to home. Pt verbalizes understanding of d/c instructions, aware residual symptoms may take months to resolve. Rx's for Tizanidine 2mg  1-2 po q8jrs prn spasm & Norco 5/325 1-2 po q4-6hrs prn pain, and Gabapentinm 300mg  po tid will be sent. Pt has f/u appt with DrStern scheduled.    Georgiann Cockeroteat, Brian 01/18/2017, 8:06 AM   Patient is doing well.  Discharge home.

## 2017-01-18 NOTE — Progress Notes (Signed)
Physical Therapy Treatment Patient Details Name: Jimmy Boyer MRN: [id number] DOB: 1961/01/04 Today's Date: 01/18/2017    History of Present Illness Pt is a 56 y/o male who presents s/p C4-C6 ACDF on 01/17/17.    PT Comments    Pt progressing well towards physical therapy goals. Was able to negotiate a flight of stairs this session with close guard for safety. Pt continues to require cues for improved posture and general safety/walker positioning during gait training. Will continue to follow.    Follow Up Recommendations  No PT follow up;Supervision for mobility/OOB     Equipment Recommendations  3in1 (PT) (will defer to OT if their recommendation is different)    Recommendations for Other Services       Precautions / Restrictions Precautions Precautions: Fall;Cervical Precaution Comments: Pt was cued for precautions during functional mobility.  Required Braces or Orthoses: Cervical Brace Cervical Brace: Soft collar Restrictions Weight Bearing Restrictions: No    Mobility  Bed Mobility Overal bed mobility: Needs Assistance Bed Mobility: Rolling;Sit to Sidelying;Sidelying to Sit Rolling: Supervision Sidelying to sit: Min guard     Sit to sidelying: Supervision General bed mobility comments: Pt was received sitting EOB.   Transfers Overall transfer level: Needs assistance Equipment used: None Transfers: Sit to/from Stand Sit to Stand: Supervision         General transfer comment: VC's for hand placement on seated surface for safety.   Ambulation/Gait Ambulation/Gait assistance: Supervision Ambulation Distance (Feet): 400 Feet Assistive device: Rolling walker (2 wheeled) Gait Pattern/deviations: Step-through pattern;Decreased stride length;Trunk flexed Gait velocity: Decreased Gait velocity interpretation: Below normal speed for age/gender General Gait Details: VC's for improved posture and safety with the RW. Light supervision  provided.   Stairs Stairs: Yes   Stair Management: Two rails;Step to pattern;Forwards Number of Stairs: 10 General stair comments: VC's for sequending and general safety.  Wheelchair Mobility    Modified Rankin (Stroke Patients Only)       Balance Overall balance assessment: Needs assistance Sitting-balance support: Feet supported;No upper extremity supported Sitting balance-Leahy Scale: Good     Standing balance support: No upper extremity supported Standing balance-Leahy Scale: Fair                              Cognition Arousal/Alertness: Awake/alert Behavior During Therapy: WFL for tasks assessed/performed Overall Cognitive Status: Within Functional Limits for tasks assessed                                        Exercises      General Comments General comments (skin integrity, edema, etc.): Wife present throughout session.       Pertinent Vitals/Pain Pain Assessment: No/denies pain    Home Living Family/patient expects to be discharged to:: Private residence Living Arrangements: Spouse/significant other;Children;Other relatives Available Help at Discharge: Family;Available 24 hours/day Type of Home: House Home Access: Stairs to enter   Home Layout: One level Home Equipment: Environmental consultant - 2 wheels;Cane - single point      Prior Function Level of Independence: Independent with assistive device(s)      Comments: Using RW all the time prior to surgery.    PT Goals (current goals can now be found in the care plan section) Acute Rehab PT Goals Patient Stated Goal: Home today PT Goal Formulation: With patient/family Time For Goal Achievement: 01/24/17 Potential to  Achieve Goals: Good Progress towards PT goals: Progressing toward goals    Frequency    Min 5X/week      PT Plan Current plan remains appropriate    Co-evaluation              AM-PAC PT "6 Clicks" Daily Activity  Outcome Measure  Difficulty turning  over in bed (including adjusting bedclothes, sheets and blankets)?: None Difficulty moving from lying on back to sitting on the side of the bed? : None Difficulty sitting down on and standing up from a chair with arms (e.g., wheelchair, bedside commode, etc,.)?: None Help needed moving to and from a bed to chair (including a wheelchair)?: None Help needed walking in hospital room?: A Little Help needed climbing 3-5 steps with a railing? : A Little 6 Click Score: 22    End of Session Equipment Utilized During Treatment: Gait belt;Cervical collar Activity Tolerance: Patient tolerated treatment well Patient left: with call bell/phone within reach;with family/visitor present (Sitting EOB eating breakfast) Nurse Communication: Mobility status PT Visit Diagnosis: Other abnormalities of gait and mobility (R26.89)     Time: [id number] PT Time Calculation (min) (ACUTE ONLY): 17 min  Charges:  $Gait Training: 8-22 mins                    G Codes:       Conni SlipperLaura Cregg Jutte, PT, DPT Acute Rehabilitation Services Pager: [phone]    Marylynn PearsonLaura D Elenora Hawbaker 01/18/2017, 10:17 AM

## 2017-01-18 NOTE — Progress Notes (Signed)
Patient alert and oriented, mae's well, voiding adequate amount of urine, swallowing without difficulty, no c/o pain at time of discharge. Patient discharged home with family. Script and discharged instructions given to patient. Patient and family stated understanding of instructions given. Patient has an appointment with Dr.Stern    

## 2017-01-18 NOTE — Discharge Instructions (Signed)

## 2017-01-18 NOTE — Evaluation (Signed)
Occupational Therapy Evaluation Patient Details Name: Jimmy Boyer MRN: [id number] DOB: 09-04-1960 Today's Date: 01/18/2017    History of Present Illness Pt is a 56 y/o male who presents s/p C4-C6 ACDF on 01/17/17.   Clinical Impression   PTA, pt was independent and living with his wife. Pt currently required Min A for ADLs with AE and Min Guard A for functional mobility with RW. Provided education on UB and LB ADLs with AE, donning/doffing cervical collar, cervical precautions, and tub transfers using 3N1. Provided handouts on tub transfer with 3N1 and AE for ADLs. Answered all pt and wife's questions. Recommend dc home once medicially stable. All acute OT needs met. Will sign off. Thank you.     Follow Up Recommendations  DC plan and follow up therapy as arranged by surgeon;No OT follow up;Supervision/Assistance - 24 hour    Equipment Recommendations  3 in 1 bedside commode    Recommendations for Other Services PT consult     Precautions / Restrictions Precautions Precautions: Fall;Cervical Precaution Comments: Precaution sheet issued and reviewed in detail. Pt was cued for precautions during ADLs and functional mobility. Required Braces or Orthoses: Cervical Brace Cervical Brace: Soft collar Restrictions Weight Bearing Restrictions: No      Mobility Bed Mobility Overal bed mobility: Needs Assistance Bed Mobility: Rolling;Sit to Sidelying;Sidelying to Sit Rolling: Supervision Sidelying to sit: Min guard     Sit to sidelying: Supervision General bed mobility comments: VC's for log roll technique. No physical assist required.   Transfers Overall transfer level: Needs assistance Equipment used: None Transfers: Sit to/from Stand Sit to Stand: Supervision         General transfer comment: VC's for hand placement on seated surface for safety.     Balance Overall balance assessment: Needs assistance Sitting-balance support: Feet supported;No upper extremity  supported Sitting balance-Leahy Scale: Good     Standing balance support: No upper extremity supported Standing balance-Leahy Scale: Fair                             ADL either performed or assessed with clinical judgement   ADL Overall ADL's : Needs assistance/impaired Eating/Feeding: Set up;Sitting   Grooming: Min guard;Standing   Upper Body Bathing: Minimal assistance;Sitting Upper Body Bathing Details (indicate cue type and reason): Educated on UB bathing and precautions Lower Body Bathing: Minimal assistance;With adaptive equipment;Sit to/from stand   Upper Body Dressing : Minimal assistance;Sitting;Cueing for compensatory techniques   Lower Body Dressing: Minimal assistance;With adaptive equipment;Cueing for sequencing;Cueing for compensatory techniques;Cueing for back precautions;Sit to/from stand Lower Body Dressing Details (indicate cue type and reason): Educated pt on LB dressing using AE. Toilet Transfer: Min guard;Ambulation;Cueing for safety;BSC         Tub/Shower Transfer Details (indicate cue type and reason): Educated pt on tub transfer with 3N1 and provided handout Functional mobility during ADLs: Min guard General ADL Comments: Pt with decreased fucntional performance. educated him on precautions and required Min VCs throughout session to maintain precautions     Vision         Perception     Praxis      Pertinent Vitals/Pain Pain Assessment: No/denies pain     Hand Dominance Right   Extremity/Trunk Assessment Upper Extremity Assessment Upper Extremity Assessment: Overall WFL for tasks assessed   Lower Extremity Assessment Lower Extremity Assessment: Defer to PT evaluation   Cervical / Trunk Assessment Cervical / Trunk Assessment: Other exceptions Cervical / Trunk  Exceptions: s/p surgery   Communication Communication Communication: No difficulties   Cognition Arousal/Alertness: Awake/alert Behavior During Therapy: WFL for  tasks assessed/performed Overall Cognitive Status: Within Functional Limits for tasks assessed                                     General Comments  Wife present throughout session. Provided hand out on AE and tub transfer. Answered pt and wife's questions.  educated pt on donning/doffing collar and washing collar    Exercises     Shoulder Instructions      Home Living Family/patient expects to be discharged to:: Private residence Living Arrangements: Spouse/significant other;Children;Other relatives Available Help at Discharge: Family;Available 24 hours/day Type of Home: House Home Access: Stairs to enter [employer] of Steps: 3   Home Layout: One level     Bathroom Shower/Tub: Teacher, early years/pre: Standard     Home Equipment: Environmental consultant - 2 wheels;Cane - single point          Prior Functioning/Environment Level of Independence: Independent with assistive device(s)        Comments: Using RW all the time prior to surgery.         OT Problem List: Decreased strength;Decreased range of motion;Impaired balance (sitting and/or standing);Decreased activity tolerance;Decreased safety awareness;Decreased knowledge of use of DME or AE;Decreased knowledge of precautions;Pain      OT Treatment/Interventions:      OT Goals(Current goals can be found in the care plan section) Acute Rehab OT Goals Patient Stated Goal: Home tomorrow OT Goal Formulation: With patient Time For Goal Achievement: 02/01/17 Potential to Achieve Goals: Good  OT Frequency:     Barriers to D/C:            Co-evaluation              AM-PAC PT "6 Clicks" Daily Activity     Outcome Measure Help from another person eating meals?: None Help from another person taking care of personal grooming?: A Little Help from another person toileting, which includes using toliet, bedpan, or urinal?: A Little Help from another person bathing (including washing,  rinsing, drying)?: A Lot Help from another person to put on and taking off regular upper body clothing?: A Little Help from another person to put on and taking off regular lower body clothing?: A Little 6 Click Score: 18   End of Session Equipment Utilized During Treatment: Rolling walker;Cervical collar Nurse Communication: Mobility status;Precautions  Activity Tolerance: Patient tolerated treatment well Patient left: with call bell/phone within reach;with family/visitor present;Other (comment) (EOB)  OT Visit Diagnosis: Unsteadiness on feet (R26.81);Other abnormalities of gait and mobility (R26.89);Muscle weakness (generalized) (M62.81);Pain Pain - Right/Left:  (Back) Pain - part of body:  (Back)                Time: 2992-4268 OT Time Calculation (min): 29 min Charges:  OT General Charges $OT Visit: 1 Procedure OT Evaluation $OT Eval Low Complexity: 1 Procedure OT Treatments $Self Care/Home Management : 8-22 mins G-Codes:     [place], OTR/L Acute Rehab Pager: [phone] Office: [place] 01/18/2017, 9:08 AM

## 2019-02-19 IMAGING — RF DG CERVICAL SPINE 1V
1 series · 1 of 1 positions shown · non-contrast
Comparison: None.

CLINICAL DATA: ACDF C4-C6

EXAM:
DG C-ARM 61-120 MIN; DG CERVICAL SPINE - 1 VIEW

[Series 1: run · 1 of 1 slices shown]
[im 1/1]
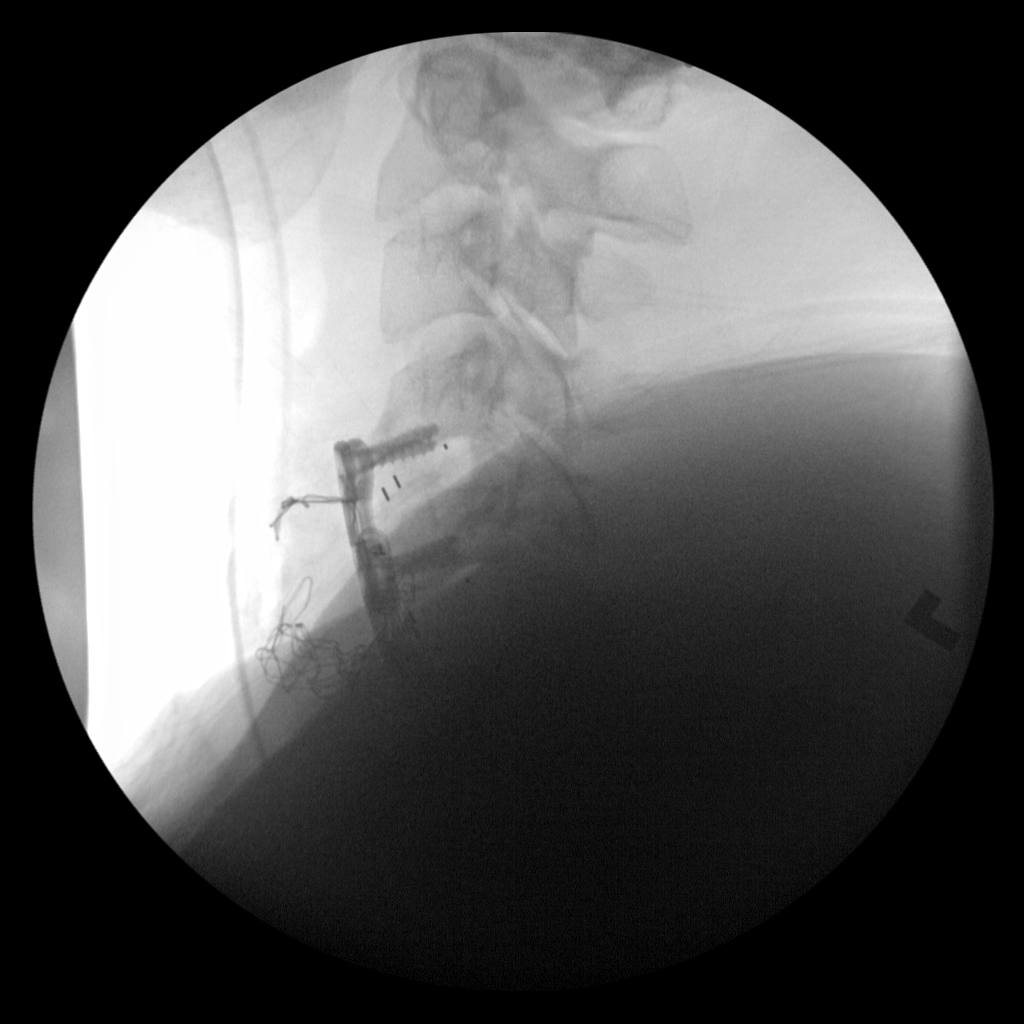

[1 of 1 positions shown; findings below may reference images not displayed]

FINDINGS: Fluoroscopy time 0 minutes 6 seconds. Solitary spot fluoroscopic
nondiagnostic intraoperative lateral cervical spine radiograph
demonstrates surgical plate with interlocking screws from C4-C6,
only partially visualized at the C6 level. The C4 pedicle screws
overlie the anterior inferior margin of the C4 vertebral body.
IMPRESSION: Intraoperative fluoroscopic guidance for ACDF C4-C6. The C4 pedicle
screws overlie the anterior inferior margin of the C4 vertebral body
on this single lateral intraoperative view. Recommend dedicated
cervical spine radiographs for further evaluation.

These results will be called to the ordering clinician or
representative by the Radiologist Assistant, and communication
documented in the PACS or zVision Dashboard.
